# Patient Record
Sex: Male | Born: 1995 | Race: White | Hispanic: No | Marital: Single | Smoking: Never smoker
Health system: Southern US, Community
[De-identification: ages and names within clinical notes are randomized; demographics above are authoritative.]

## PROBLEM LIST (undated history)

## (undated) DIAGNOSIS — F329 Major depressive disorder, single episode, unspecified: Secondary | ICD-10-CM

## (undated) DIAGNOSIS — F32A Depression, unspecified: Secondary | ICD-10-CM

## (undated) DIAGNOSIS — T7840XA Allergy, unspecified, initial encounter: Secondary | ICD-10-CM

## (undated) DIAGNOSIS — R002 Palpitations: Secondary | ICD-10-CM

## (undated) HISTORY — DX: Palpitations: R00.2

## (undated) HISTORY — PX: NO PAST SURGERIES: SHX2092

## (undated) HISTORY — DX: Allergy, unspecified, initial encounter: T78.40XA

## (undated) HISTORY — DX: Depression, unspecified: F32.A

## (undated) HISTORY — DX: Major depressive disorder, single episode, unspecified: F32.9

---

## 2017-04-09 ENCOUNTER — Emergency Department
Admission: EM | Admit: 2017-04-09 | Discharge: 2017-04-09 | Disposition: A | Discharge status: Home / Self Care | Payer: BLUE CROSS/BLUE SHIELD | Attending: Emergency Medicine | Admitting: Emergency Medicine

## 2017-04-09 ENCOUNTER — Other Ambulatory Visit: Payer: Self-pay

## 2017-04-09 ENCOUNTER — Emergency Department: Payer: BLUE CROSS/BLUE SHIELD

## 2017-04-09 DIAGNOSIS — R002 Palpitations: Secondary | ICD-10-CM | POA: Insufficient documentation

## 2017-04-09 DIAGNOSIS — R0602 Shortness of breath: Secondary | ICD-10-CM | POA: Diagnosis not present

## 2017-04-09 DIAGNOSIS — I499 Cardiac arrhythmia, unspecified: Secondary | ICD-10-CM | POA: Diagnosis not present

## 2017-04-09 LAB — COMPREHENSIVE METABOLIC PANEL
ALT: 13 U/L — AB (ref 17–63)
ANION GAP: 12 (ref 5–15)
AST: 18 U/L (ref 15–41)
Albumin: 4.8 g/dL (ref 3.5–5.0)
Alkaline Phosphatase: 54 U/L (ref 38–126)
BUN: 9 mg/dL (ref 6–20)
CHLORIDE: 107 mmol/L (ref 101–111)
CO2: 21 mmol/L — AB (ref 22–32)
CREATININE: 0.82 mg/dL (ref 0.61–1.24)
Calcium: 9.7 mg/dL (ref 8.9–10.3)
Glucose, Bld: 88 mg/dL (ref 65–99)
POTASSIUM: 3.4 mmol/L — AB (ref 3.5–5.1)
SODIUM: 140 mmol/L (ref 135–145)
Total Bilirubin: 2.9 mg/dL — ABNORMAL HIGH (ref 0.3–1.2)
Total Protein: 7.5 g/dL (ref 6.5–8.1)

## 2017-04-09 LAB — TROPONIN I

## 2017-04-09 LAB — CBC WITH DIFFERENTIAL/PLATELET
Basophils Absolute: 0 10*3/uL (ref 0–0.1)
Basophils Relative: 0 %
EOS ABS: 0.1 10*3/uL (ref 0–0.7)
EOS PCT: 1 %
HCT: 45.9 % (ref 40.0–52.0)
Hemoglobin: 15.5 g/dL (ref 13.0–18.0)
LYMPHS ABS: 2.1 10*3/uL (ref 1.0–3.6)
LYMPHS PCT: 26 %
MCH: 31.6 pg (ref 26.0–34.0)
MCHC: 33.8 g/dL (ref 32.0–36.0)
MCV: 93.5 fL (ref 80.0–100.0)
MONO ABS: 0.4 10*3/uL (ref 0.2–1.0)
Monocytes Relative: 5 %
Neutro Abs: 5.7 10*3/uL (ref 1.4–6.5)
Neutrophils Relative %: 68 %
PLATELETS: 245 10*3/uL (ref 150–440)
RBC: 4.91 MIL/uL (ref 4.40–5.90)
RDW: 13.5 % (ref 11.5–14.5)
WBC: 8.4 10*3/uL (ref 3.8–10.6)

## 2017-04-09 LAB — TSH: TSH: 0.759 u[IU]/mL (ref 0.350–4.500)

## 2017-04-09 NOTE — ED Provider Notes (Signed)
[facility] Emergency Department Provider Note   ____________________________________________    I have reviewed the triage vital signs and the nursing notes.   HISTORY  Chief Complaint Palpitations     HPI Craig Ruiz is a 22 y.o. male who presents with complaints of palpitations.  Patient reports several episodes of palpitations over the last few months, however last night he had when he describes as a fairly severe episode where initially he felt that his heart was missing beats and then it became very rapid and like it was beating out of his chest.  He felt mildly short of breath but denies chest pain.  On arrival to the ED feels significant better, is no longer having any palpitations.  No fevers or chills no shortness of breath.  No calf pain or swelling.  No recent travel.  No history of thyroid disease.  Mother does report a history of arrhythmia that required EP.   No past medical history on file.  No past medical history  There are no active problems to display for this patient.     Prior to Admission medications   Not on File  Does not use any medications  Allergies Patient has no known allergies.  No family history on file.  Social History Patient denies drug or alcohol use  Review of Systems  Constitutional: No fever/chills, no weight loss  eyes: No visual changes.  ENT: No neck swelling Cardiovascular: Denies chest pain.  Palpation's as above Respiratory: Denies shortness of breath. Gastrointestinal: No abdominal pain.  No nausea, no vomiting.   Genitourinary: Negative for dysuria. Musculoskeletal: Negative for back pain. Skin: Negative for rash. Neurological: Negative for headaches    ____________________________________________   PHYSICAL EXAM:  VITAL SIGNS: ED Triage Vitals [04/09/17 2005]  Enc Vitals Group     BP 140/63     Pulse Rate 80     Resp 16     Temp 98.9 F (37.2 C)     Temp Source Oral    SpO2 97 %     Weight 63.5 kg (140 lb)     Height 1.803 m (5\' 11" )     Head Circumference      Peak Flow      Pain Score      Pain Loc      Pain Edu?      Excl. in [place]?     Constitutional: Alert and oriented. No acute distress. Pleasant and interactive Eyes: Conjunctivae are normal.   Nose: No congestion/rhinnorhea. Mouth/Throat: Mucous membranes are moist.    Cardiovascular: Normal rate, regular rhythm. Grossly normal heart sounds.  Good peripheral circulation. Respiratory: Normal respiratory effort.  No retractions. Gastrointestinal: Soft and nontender. No distention Genitourinary: deferred Musculoskeletal: Warm and well perfused Neurologic:  Normal speech and language. No gross focal neurologic deficits are appreciated.  Skin:  Skin is warm, dry and intact. No rash noted. Psychiatric: Mood and affect are normal. Speech and behavior are normal.  ____________________________________________   LABS (all labs ordered are listed, but only abnormal results are displayed)  Labs Reviewed  COMPREHENSIVE METABOLIC PANEL - Abnormal; Notable for the following components:      Result Value   Potassium 3.4 (*)    CO2 21 (*)    ALT 13 (*)    Total Bilirubin 2.9 (*)    All other components within normal limits  CBC WITH DIFFERENTIAL/PLATELET  TROPONIN I  TSH   ____________________________________________  EKG ED ECG REPORT I,  Lavonia Drafts, the attending physician, personally viewed and interpreted this ECG.  Date: 04/09/2017  Rhythm: normal sinus rhythm QRS Axis: normal Intervals: normal ST/T Wave abnormalities: normal Narrative Interpretation: Sinus arrhythmia  ____________________________________________  RADIOLOGY  Chest x-ray normal ____________________________________________   PROCEDURES  Procedure(s) performed: No  Procedures   Critical Care performed: No ____________________________________________   INITIAL IMPRESSION / ASSESSMENT AND PLAN / ED  COURSE  Pertinent labs & imaging results that were available during my care of the patient were reviewed by me and considered in my medical decision making (see chart for details).  Patient well-appearing in no acute distress.  EKG is reassuring.  Description of palpitations is suspicious for SVT versus PVCs.  Asymptomatic in the emergency department.  Workup is overall quite reassuring.  However I would like to have the patient followed by cardiology closely with likely 30-day monitor, patient and mother agree with this plan.  Strict return precautions discussed    ____________________________________________   FINAL CLINICAL IMPRESSION(S) / ED DIAGNOSES  Final diagnoses:  Palpitations        Note:  This document was prepared using Dragon voice recognition software and may include unintentional dictation errors.    Lavonia Drafts, MD 04/09/17 2214

## 2017-04-09 NOTE — ED Triage Notes (Signed)
Pt states has had palpitations for months. Pt states this am his palpitations were worse. Pt states last pm he was not able to sleep due to increased palpitations. Pt denies pain, states he does have shob when palpitations occur.

## 2017-04-10 ENCOUNTER — Telehealth: Payer: Self-pay

## 2017-04-10 DIAGNOSIS — R002 Palpitations: Secondary | ICD-10-CM

## 2017-04-10 NOTE — Telephone Encounter (Signed)
Pt in ED 1/27 for palpitations which he reports have been happening over the last few months. Palpitations worsened yesterday and he presented to the ED for evaluation.   Per Dr. Kyra Manges notes: "Patient well-appearing in no acute distress.  EKG is reassuring.  Description of palpitations is suspicious for SVT versus PVCs.  Asymptomatic in the emergency department.  Workup is overall quite reassuring.  However I would like to have the patient followed by cardiology closely with likely 30-day monitor, patient and mother agree with this plan.  Strict return precautions discussed"  Pt was scheduled for March OV with Dr. Rockey Situ. MD states he will review ED notes to determine if holter monitor may be ordered before appt. Routed to Dr. Rockey Situ and Jeannene Patella.

## 2017-04-10 NOTE — Telephone Encounter (Signed)
Okay to order monitor

## 2017-04-10 NOTE — Telephone Encounter (Signed)
Pt mother calling stating pt was in ED last night and was suggested that he may need monitor prior to appointment   Please advise.

## 2017-04-11 NOTE — Addendum Note (Signed)
Addended by: Valora Corporal on: 04/11/2017 12:11 PM   Modules accepted: Orders

## 2017-04-11 NOTE — Telephone Encounter (Signed)
Spoke with patients mother and reviewed that we will order 30 day monitor and have that shipped to the mailing address. Advised that they would call to verify this information before sending it out so to watch for their call. Discussed monitor and that instructions will be in the box. Reviewed information in detail and confirmed upcoming appointment and she verbalized understanding with no further questions at this time. Order entered and enrolled online to be shipped out.

## 2017-04-23 ENCOUNTER — Ambulatory Visit (INDEPENDENT_AMBULATORY_CARE_PROVIDER_SITE_OTHER): Payer: BLUE CROSS/BLUE SHIELD

## 2017-04-23 DIAGNOSIS — R002 Palpitations: Secondary | ICD-10-CM | POA: Diagnosis not present

## 2017-05-13 NOTE — Progress Notes (Signed)
Cardiology Office Note  Date:  05/15/2017   ID:  Georgeanna Lea, DOB Nov 09, 1995, MRN [id number]  PCP:  Patient, No Pcp Per   Chief Complaint  Patient presents with  . OTHER    F/u ED due to palpitations/chest pain. Meds reviewed verbally with pt.    HPI:   Lincoln Ginley is a 22 y.o. male  With history of tachycardia Who presents by referral from Dr. Corky Downs in the emergency room for  paroxysmal tachycardia  Reports that over the past 6 months he has had episodes of tachycardia Reports having one severe episode in January 2019 ER 04/09/2017  severe episode where initially he felt that his heart was missing beats and then it became very rapid and like it was beating out of his chest.   mildly short of breath but denies chest pain.   He became Exhausted, shirt moving up and down, pale , dizzy Witnessed by his mother Rhythm broke before he was seen in the emergency room. He waited for his mother to get home then a presented to the emergency room  Was felt to possibly have SVT  Lab work reviewed personally by myself tbili 2.9 Potassium 3.4  Discussion with family today, Mother with "ablation", "virus", weak heart  Turning wearing 30 day monitor and reports he has had several episodes of tachycardia with a monitor in place We did call the company and have received preliminary data on his rhythm Episode of narrow complex tachycardia rate 170 bpm every 25th 2019 at 2:48 PM Frequent APCs, PVCs atrial tachycardia noted. 25th 2019 2:46 PM rate 140s  EKG personally reviewed by myself on todays visitShows normal sinus rhythm rate 61 bpm repolarization abnormality , no significant ST-T wave changes  PMH:   Tachycardia , paroxysmal    PSH:   History reviewed. No pertinent surgical history.  Current Outpatient Medications  Medication Sig Dispense Refill  . diltiazem (CARDIZEM) 30 MG tablet Take 1 tablet (30 mg total) by mouth 3 (three) times daily as needed (As needed for fast heart  rate). 90 tablet 1  . propranolol (INDERAL) 20 MG tablet Take 1 tablet (20 mg total) by mouth 3 (three) times daily as needed (As needed for fast heart rate). 90 tablet 1   No current facility-administered medications for this visit.      Allergies:   Patient has no known allergies.   Social History:  The patient  reports that  has never smoked. he has never used smokeless tobacco. He reports that he does not drink alcohol or use drugs.   Family History:   family history includes Arrhythmia in his mother; Heart failure in his mother.    Review of Systems: Review of Systems  Constitutional: Negative.   Respiratory: Negative.   Cardiovascular: Negative.   Gastrointestinal: Negative.   Musculoskeletal: Negative.   Neurological: Negative.   Psychiatric/Behavioral: Negative.   All other systems reviewed and are negative.    PHYSICAL EXAM: VS:  BP 114/78 (BP Location: Right Arm, Patient Position: Sitting, Cuff Size: Normal)   Pulse 61   Ht 5\' 11"  (1.803 m)   Wt 137 lb (62.1 kg)   BMI 19.11 kg/m  , BMI Body mass index is 19.11 kg/m. GEN: Well nourished, well developed, in no acute distress  HEENT: normal  Neck: no JVD, carotid bruits, or masses Cardiac: RRR; no murmurs, rubs, or gallops,no edema  Respiratory:  clear to auscultation bilaterally, normal work of breathing GI: soft, nontender, nondistended, + BS MS: no  deformity or atrophy  Skin: warm and dry, no rash Neuro:  Strength and sensation are intact Psych: euthymic mood, full affect    Recent Labs: 04/09/2017: ALT 13; BUN 9; Creatinine, Ser 0.82; Hemoglobin 15.5; Platelets 245; Potassium 3.4; Sodium 140; TSH 0.759    Lipid Panel No results found for: CHOL, HDL, LDLCALC, TRIG    Wt Readings from Last 3 Encounters:  05/15/17 137 lb (62.1 kg) (22 %, Z= -0.76)*  04/09/17 140 lb (63.5 kg) (28 %, Z= -0.59)*   * Growth percentiles are based on CDC (Boys, 2-20 Years) data.       ASSESSMENT AND  PLAN:  Paroxysmal tachycardia ([place]) - Plan: EKG 12-Lead Several episodes over the past 3 weeks per the patient 30 day monitor reviewed in pieces Final report pending Plan as below Long discussion concerning various stops of arrhythmia  Palpitations - Plan: EKG 12-Lead Frequent APCs and PVCs noted  Paroxysmal SVT (supraventricular tachycardia) ([facility]) Very severe symptoms general 2019 when he went to the emergency room Symptoms concerning for supraventricular tachycardia Was not able to obtain EKG while he was in this rhythm as he waited for his mother to get home Having rare episodes but still symptomatic Long discussion concerning mechanism, management Long discussion concerning ablation, medications at could be used He does not want a medication every day, does not want ablation at this time Prefers to try medications on an as-needed basis Mother is wary of beta blockers Recommended he try propranolol 10-20 mg as needed, and diltiazem 30 mg pills as needed  Atrial tachycardia ([place])  unable to exclude other arrhythmia such as atrial tachycardia   30 day monitor has not been completed though we did receive 1 rhythm strip after contacting the company with rate 140 Again recommended beta blockers  Elevated total bilirubin Seen on lab work in the emergency room 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 EKG 12-Lead     Signed, Esmond Plants, M.D., Ph.D. 05/15/2017  [place], [place]

## 2017-05-15 ENCOUNTER — Encounter: Payer: Self-pay | Admitting: Cardiovascular Disease

## 2017-05-15 ENCOUNTER — Ambulatory Visit (INDEPENDENT_AMBULATORY_CARE_PROVIDER_SITE_OTHER): Payer: BLUE CROSS/BLUE SHIELD | Admitting: Cardiovascular Disease

## 2017-05-15 VITALS — BP 114/78 | HR 61 | Ht 71.0 in | Wt 137.0 lb

## 2017-05-15 DIAGNOSIS — R002 Palpitations: Secondary | ICD-10-CM

## 2017-05-15 DIAGNOSIS — I471 Supraventricular tachycardia: Secondary | ICD-10-CM

## 2017-05-15 DIAGNOSIS — I479 Paroxysmal tachycardia, unspecified: Secondary | ICD-10-CM

## 2017-05-15 MED ORDER — DILTIAZEM HCL 30 MG PO TABS: 30.0000 mg | ORAL_TABLET | Freq: Three times a day (TID) | ORAL | 1 refills | Status: DC | PRN

## 2017-05-15 MED ORDER — PROPRANOLOL HCL 20 MG PO TABS: 20.0000 mg | ORAL_TABLET | Freq: Three times a day (TID) | ORAL | 1 refills | Status: DC | PRN

## 2017-05-15 NOTE — Patient Instructions (Addendum)
Research atrial tachycardia (rate 100 to 150s)  SVT,  supra ventricular tachycardia ( rate 150 to 220),  easier to ablate   Medication Instructions:  Your physician has recommended you make the following change in your medication:  1. Propranolol 20 mg every 4 to 6 hours as needed for fast heart rate   OR 2. Diltiazem 30 mg every 4 to 6 hours as needed for fast heart rate   Labwork:  No new labs needed  Testing/Procedures:  No further testing at this time   Follow-Up: It was a pleasure seeing you in the office today. Please call us if you have new issues that need to be addressed before your next appt.  [phone]  Your physician wants you to follow-up in:  As needed  If you need a refill on your cardiac medications before your next appointment, please call your pharmacy.  For educational health videos Log in to : [email or web address] Or : [email or web address], password : [email or web address]

## 2018-04-12 ENCOUNTER — Ambulatory Visit (INDEPENDENT_AMBULATORY_CARE_PROVIDER_SITE_OTHER): Payer: BLUE CROSS/BLUE SHIELD | Admitting: Family Medicine

## 2018-04-12 ENCOUNTER — Encounter: Payer: Self-pay | Admitting: Family Medicine

## 2018-04-12 VITALS — BP 104/62 | HR 72 | Temp 98.8°F | Ht 69.5 in | Wt 128.8 lb

## 2018-04-12 DIAGNOSIS — F410 Panic disorder [episodic paroxysmal anxiety] without agoraphobia: Secondary | ICD-10-CM | POA: Diagnosis not present

## 2018-04-12 DIAGNOSIS — F411 Generalized anxiety disorder: Secondary | ICD-10-CM | POA: Diagnosis not present

## 2018-04-12 DIAGNOSIS — F430 Acute stress reaction: Secondary | ICD-10-CM | POA: Diagnosis not present

## 2018-04-12 DIAGNOSIS — R464 Slowness and poor responsiveness: Secondary | ICD-10-CM | POA: Insufficient documentation

## 2018-04-12 MED ORDER — SERTRALINE HCL 50 MG PO TABS: ORAL_TABLET | ORAL | 3 refills | Status: DC

## 2018-04-12 NOTE — Assessment & Plan Note (Signed)
Given other psych issues this seems most likely. Though possibility that this could be seizure disorder. Discussed neurology vs starting medication and patient elected to start medication and will place neurology referral to see if symptoms worsen/or do not improve.

## 2018-04-12 NOTE — Progress Notes (Signed)
Subjective:     Craig Ruiz is a 23 y.o. male presenting for Establish Care (previous PCP with [facility]); Anxiety (has not been treated for this before. Randomly gets attacks of feeling something coming on "like a bad dejavu"); and Depression     Anxiety  Presents for initial visit. Onset was 1 to 6 months ago. The problem has been unchanged. Symptoms include confusion, decreased concentration, dizziness, excessive worry, insomnia, irritability, nausea, nervous/anxious behavior and obsessions (will repeatedly fix the same thing on his car). Patient reports no hyperventilation, malaise, palpitations, restlessness or shortness of breath. Episode frequency: has an episode every day. The most recent episode lasted 30 minutes. The severity of symptoms is interfering with daily activities, incapacitating and causing significant distress. The symptoms are aggravated by work stress (early in the AM or towards lunchtime). The patient sleeps 5 hours per night. The quality of sleep is fair. Nighttime awakenings: one to two.   There are no known risk factors. Treatments tried: trying to work on car or spend time with friends to take his mind off of things.  Depression         This is a new problem.  The current episode started more than 1 month ago.   The onset quality is gradual.   Associated symptoms include decreased concentration and insomnia.  Associated symptoms include no restlessness.     Exacerbated by: with break-up from partner.  Past medical history includes anxiety.     #Passing out - will being feeling completely normal - then will have an episode where he feels like everything goes "fuzzy" - out of body experience like - mom has witnessed episodes and he was completely glazed over - has a bewildered look on his face - no twitching or shaking - often associated with stress, but occasionally be random w/o worry  Review of Systems  Constitutional: Positive for  irritability. Negative for chills and fever.  HENT: Negative.   Eyes:       Blurry vision with episodes  Respiratory: Negative for shortness of breath.   Cardiovascular: Negative for palpitations.  Gastrointestinal: Positive for nausea.  Endocrine: Negative.   Genitourinary: Negative.   Musculoskeletal: Negative.   Allergic/Immunologic: Negative.   Neurological: Positive for dizziness.  Hematological: Negative.   Psychiatric/Behavioral: Positive for confusion, decreased concentration and depression. The patient is nervous/anxious and has insomnia.    PMH, PSH, FMHx reviewed and updated in EPIC   Social History   Tobacco Use  Smoking Status Never Smoker  Smokeless Tobacco Never Used   Social History   Social History Narrative   Lives with Mom and Dad, gets along well   Works at apartment complex as Fish farm manager   Enjoys: work on his car, spend time with friends   Exercise: walking around at work and heavy lifting   Diet: mostly salads, and chicken at home   Has a partner - Raquel Sarna        Objective:    BP Readings from Last 3 Encounters:  04/12/18 104/62  05/15/17 114/78  04/09/17 112/72   Wt Readings from Last 3 Encounters:  04/12/18 128 lb 12 oz (58.4 kg)  05/15/17 137 lb (62.1 kg) (22 %, Z= -0.76)*  04/09/17 140 lb (63.5 kg) (28 %, Z= -0.59)*   * Growth percentiles are based on CDC (Boys, 2-20 Years) data.    BP 104/62   Pulse 72   Temp 98.8 F (37.1 C)   Ht 5' 9.5" (1.765 m)  Wt 128 lb 12 oz (58.4 kg)   SpO2 97%   BMI 18.74 kg/m    Physical Exam Constitutional:      Appearance: Normal appearance. He is not ill-appearing or diaphoretic.  HENT:     Right Ear: External ear normal.     Left Ear: External ear normal.     Nose: Nose normal.  Eyes:     General: No scleral icterus.    Extraocular Movements: Extraocular movements intact.     Conjunctiva/sclera: Conjunctivae normal.  Neck:     Musculoskeletal: Neck supple.  Cardiovascular:      Rate and Rhythm: Normal rate.  Pulmonary:     Effort: Pulmonary effort is normal.  Skin:    General: Skin is warm and dry.  Neurological:     Mental Status: He is alert. Mental status is at baseline.  Psychiatric:        Attention and Perception: Attention normal.        Mood and Affect: Mood is depressed. Affect is blunt.        Speech: Speech normal.        Behavior: Behavior normal. Behavior is cooperative.        Thought Content: Thought content normal. Thought content does not include homicidal or suicidal plan.    GAD 7 : Generalized Anxiety Score 04/12/2018  Nervous, Anxious, on Edge 2  Control/stop worrying 2  Worry too much - different things 2  Trouble relaxing 2  Restless 2  Easily annoyed or irritable 2  Afraid - awful might happen 2  Total GAD 7 Score 14  Anxiety Difficulty Very difficult   Depression screen PHQ 2/9 04/12/2018  Decreased Interest 2  Down, Depressed, Hopeless 2  PHQ - 2 Score 4  Altered sleeping 3  Tired, decreased energy 1  Change in appetite 1  Feeling bad or failure about yourself  2  Trouble concentrating 2  Moving slowly or fidgety/restless 2  Suicidal thoughts 1  PHQ-9 Score 16  Difficult doing work/chores Very difficult         Assessment & Plan:   Problem List Items Addressed This Visit      Other   Generalized anxiety disorder with panic attacks - Primary    GAD elevated and episodes could be panic attacks. Discussed therapy but patient declined. Will start medication.       Relevant Medications   sertraline (ZOLOFT) 50 MG tablet   Acute stress reaction   Relevant Medications   sertraline (ZOLOFT) 50 MG tablet   Slowness and poor responsiveness    Given other psych issues this seems most likely. Though possibility that this could be seizure disorder. Discussed neurology vs starting medication and patient elected to start medication and will place neurology referral to see if symptoms worsen/or do not improve.        Relevant Orders   Ambulatory referral to Neurology       Return in about 4 weeks (around 05/10/2018).  Lesleigh Noe, MD

## 2018-04-12 NOTE — Assessment & Plan Note (Signed)
GAD elevated and episodes could be panic attacks. Discussed therapy but patient declined. Will start medication.

## 2018-04-12 NOTE — Patient Instructions (Signed)
You are going to start a new antidepressant medication.   One of the risks of this medication is increase in suicidal thoughts.   Your suicide Action plan is as follows:  1) Call Mom 2) Call the Suicide Hotline [phone] which is available 24 hours 3) Call the Clinic   The most common side effect is stomach upset. If this happens it means the medication is working. It should get better in 1-3 weeks.   Medication for depression and anxiety often takes 6-8 weeks to have a noticeable difference so stick with it. Also the best way for recovery is taking medication and seeing a therapist -- this is so important.    Will also refer to neurology just to check

## 2018-05-10 ENCOUNTER — Ambulatory Visit (INDEPENDENT_AMBULATORY_CARE_PROVIDER_SITE_OTHER): Payer: BLUE CROSS/BLUE SHIELD | Admitting: Family Medicine

## 2018-05-10 ENCOUNTER — Encounter: Payer: Self-pay | Admitting: Family Medicine

## 2018-05-10 VITALS — BP 98/62 | HR 63 | Temp 98.3°F | Resp 12 | Ht 69.5 in | Wt 130.8 lb

## 2018-05-10 DIAGNOSIS — F411 Generalized anxiety disorder: Secondary | ICD-10-CM | POA: Diagnosis not present

## 2018-05-10 DIAGNOSIS — F410 Panic disorder [episodic paroxysmal anxiety] without agoraphobia: Secondary | ICD-10-CM | POA: Diagnosis not present

## 2018-05-10 DIAGNOSIS — F430 Acute stress reaction: Secondary | ICD-10-CM | POA: Diagnosis not present

## 2018-05-10 DIAGNOSIS — R464 Slowness and poor responsiveness: Secondary | ICD-10-CM

## 2018-05-10 MED ORDER — SERTRALINE HCL 100 MG PO TABS: 100.0000 mg | ORAL_TABLET | Freq: Every day | ORAL | 1 refills | Status: DC

## 2018-05-10 NOTE — Patient Instructions (Addendum)
#   Anxiety - Glad you are doing better - if you want to increase to 150 mg in 2-3 weeks you can, and then you can go up to 200 mg if you would like   Return in 8 weeks to check in and see how things are going

## 2018-05-10 NOTE — Progress Notes (Signed)
Subjective:     Craig Ruiz is a 23 y.o. male presenting for Anxiety (4 week follow up)     HPI   #Anxiety/depression - Started sertraline - a lot of stressful events - sister almost died - doing better overall - did increase to 100 mg  - has noticed a difference - easier to cope with things - has noticed appetite   #episodes of poor responsiveness - are the same, no improvement - typically occur at rest  - has happened a few times during driving  - is aware of what he is doing, but it is weird sensation  - has pulled off the road - occasionally notices some racing heart or skipping beats   Review of Systems See HPI  04/12/2018: Clinic - GAD and stress > Sertraline. Neuro referral for slow responsiveness.   Social History   Tobacco Use  Smoking Status Never Smoker  Smokeless Tobacco Never Used        Objective:    BP Readings from Last 3 Encounters:  05/10/18 98/62  04/12/18 104/62  05/15/17 114/78   Wt Readings from Last 3 Encounters:  05/10/18 130 lb 12 oz (59.3 kg)  04/12/18 128 lb 12 oz (58.4 kg)  05/15/17 137 lb (62.1 kg) (22 %, Z= -0.76)*   * Growth percentiles are based on CDC (Boys, 2-20 Years) data.    BP 98/62   Pulse 63   Temp 98.3 F (36.8 C)   Resp 12   Ht 5' 9.5" (1.765 m)   Wt 130 lb 12 oz (59.3 kg)   BMI 19.03 kg/m    Physical Exam Constitutional:      Appearance: Normal appearance. He is not ill-appearing or diaphoretic.  HENT:     Right Ear: External ear normal.     Left Ear: External ear normal.     Nose: Nose normal.  Eyes:     General: No scleral icterus.    Extraocular Movements: Extraocular movements intact.     Conjunctiva/sclera: Conjunctivae normal.  Neck:     Musculoskeletal: Neck supple.  Cardiovascular:     Rate and Rhythm: Normal rate.  Pulmonary:     Effort: Pulmonary effort is normal.  Skin:    General: Skin is warm and dry.  Neurological:     Mental Status: He is alert. Mental status is at  baseline.  Psychiatric:        Mood and Affect: Mood normal.      GAD 7 : Generalized Anxiety Score 05/10/2018 04/12/2018  Nervous, Anxious, on Edge 2 2  Control/stop worrying 2 2  Worry too much - different things 2 2  Trouble relaxing 2 2  Restless 1 2  Easily annoyed or irritable 1 2  Afraid - awful might happen 1 2  Total GAD 7 Score 11 14  Anxiety Difficulty Somewhat difficult Very difficult    Depression screen [facility] 2/9 05/10/2018 04/12/2018  Decreased Interest 1 2  Down, Depressed, Hopeless 2 2  PHQ - 2 Score 3 4  Altered sleeping 1 3  Tired, decreased energy 1 1  Change in appetite 1 1  Feeling bad or failure about yourself  2 2  Trouble concentrating 2 2  Moving slowly or fidgety/restless 1 2  Suicidal thoughts 1 1  PHQ-9 Score 12 16  Difficult doing work/chores Somewhat difficult Very difficult        Assessment & Plan:   Problem List Items Addressed This Visit  Other   Generalized anxiety disorder with panic attacks    Doing better with medication. Discussed that it is OK to increase to 150 mg in a few weeks if not noticing much change. Declined therapy.       Relevant Medications   sertraline (ZOLOFT) 100 MG tablet   Acute stress reaction   Relevant Medications   sertraline (ZOLOFT) 100 MG tablet   Slowness and poor responsiveness - Primary    No change with medication. Already has neurology scheduled. Does not seem cardiac in origin based on hx          Return in about 8 weeks (around 07/05/2018).  Lesleigh Noe, MD

## 2018-05-10 NOTE — Assessment & Plan Note (Signed)
Doing better with medication. Discussed that it is OK to increase to 150 mg in a few weeks if not noticing much change. Declined therapy.

## 2018-05-10 NOTE — Assessment & Plan Note (Signed)
No change with medication. Already has neurology scheduled. Does not seem cardiac in origin based on hx

## 2018-05-28 DIAGNOSIS — D496 Neoplasm of unspecified behavior of brain: Secondary | ICD-10-CM | POA: Diagnosis not present

## 2018-05-28 DIAGNOSIS — G40A09 Absence epileptic syndrome, not intractable, without status epilepticus: Secondary | ICD-10-CM | POA: Diagnosis not present

## 2018-05-28 DIAGNOSIS — R404 Transient alteration of awareness: Secondary | ICD-10-CM | POA: Diagnosis not present

## 2018-05-28 DIAGNOSIS — G9389 Other specified disorders of brain: Secondary | ICD-10-CM | POA: Diagnosis not present

## 2018-05-28 DIAGNOSIS — I499 Cardiac arrhythmia, unspecified: Secondary | ICD-10-CM | POA: Diagnosis not present

## 2018-05-28 DIAGNOSIS — F329 Major depressive disorder, single episode, unspecified: Secondary | ICD-10-CM | POA: Diagnosis not present

## 2018-05-28 DIAGNOSIS — R51 Headache: Secondary | ICD-10-CM | POA: Diagnosis not present

## 2018-05-28 DIAGNOSIS — Z4889 Encounter for other specified surgical aftercare: Secondary | ICD-10-CM | POA: Diagnosis not present

## 2018-05-28 DIAGNOSIS — R900 Intracranial space-occupying lesion found on diagnostic imaging of central nervous system: Secondary | ICD-10-CM | POA: Diagnosis not present

## 2018-05-28 DIAGNOSIS — R0902 Hypoxemia: Secondary | ICD-10-CM | POA: Diagnosis not present

## 2018-05-28 DIAGNOSIS — R000 Tachycardia, unspecified: Secondary | ICD-10-CM | POA: Diagnosis not present

## 2018-05-28 DIAGNOSIS — I491 Atrial premature depolarization: Secondary | ICD-10-CM | POA: Diagnosis not present

## 2018-05-28 DIAGNOSIS — C712 Malignant neoplasm of temporal lobe: Secondary | ICD-10-CM | POA: Diagnosis not present

## 2018-05-28 DIAGNOSIS — G40409 Other generalized epilepsy and epileptic syndromes, not intractable, without status epilepticus: Secondary | ICD-10-CM | POA: Diagnosis not present

## 2018-05-28 DIAGNOSIS — H902 Conductive hearing loss, unspecified: Secondary | ICD-10-CM | POA: Diagnosis not present

## 2018-05-28 DIAGNOSIS — R040 Epistaxis: Secondary | ICD-10-CM | POA: Diagnosis not present

## 2018-05-28 DIAGNOSIS — Z01818 Encounter for other preprocedural examination: Secondary | ICD-10-CM | POA: Diagnosis not present

## 2018-05-28 DIAGNOSIS — E348 Other specified endocrine disorders: Secondary | ICD-10-CM | POA: Diagnosis not present

## 2018-05-28 DIAGNOSIS — R930 Abnormal findings on diagnostic imaging of skull and head, not elsewhere classified: Secondary | ICD-10-CM | POA: Diagnosis not present

## 2018-05-28 DIAGNOSIS — T380X5A Adverse effect of glucocorticoids and synthetic analogues, initial encounter: Secondary | ICD-10-CM | POA: Diagnosis not present

## 2018-05-28 DIAGNOSIS — G936 Cerebral edema: Secondary | ICD-10-CM | POA: Diagnosis not present

## 2018-05-28 DIAGNOSIS — R569 Unspecified convulsions: Secondary | ICD-10-CM | POA: Diagnosis not present

## 2018-05-28 DIAGNOSIS — D430 Neoplasm of uncertain behavior of brain, supratentorial: Secondary | ICD-10-CM | POA: Diagnosis not present

## 2018-05-28 DIAGNOSIS — G40309 Generalized idiopathic epilepsy and epileptic syndromes, not intractable, without status epilepticus: Secondary | ICD-10-CM | POA: Diagnosis not present

## 2018-05-29 ENCOUNTER — Telehealth: Payer: Self-pay | Admitting: Family Medicine

## 2018-05-29 DIAGNOSIS — R569 Unspecified convulsions: Secondary | ICD-10-CM | POA: Diagnosis not present

## 2018-05-29 DIAGNOSIS — R900 Intracranial space-occupying lesion found on diagnostic imaging of central nervous system: Secondary | ICD-10-CM | POA: Diagnosis not present

## 2018-05-29 DIAGNOSIS — Z01818 Encounter for other preprocedural examination: Secondary | ICD-10-CM | POA: Diagnosis not present

## 2018-05-29 DIAGNOSIS — G9389 Other specified disorders of brain: Secondary | ICD-10-CM | POA: Diagnosis not present

## 2018-05-29 MED ORDER — LEVETIRACETAM 500 MG PO TABS
1000.00 | ORAL_TABLET | ORAL | Status: DC
Start: 2018-05-30 — End: 2018-05-29

## 2018-05-29 MED ORDER — SERTRALINE HCL 50 MG PO TABS
100.00 | ORAL_TABLET | ORAL | Status: DC
Start: 2018-06-05 — End: 2018-05-29

## 2018-05-29 MED ORDER — ACETAMINOPHEN 325 MG PO TABS
650.00 | ORAL_TABLET | ORAL | Status: DC
Start: ? — End: 2018-05-29

## 2018-05-29 MED ORDER — SODIUM CHLORIDE 0.9 % IV SOLN
75.00 | INTRAVENOUS | Status: DC
Start: ? — End: 2018-05-29

## 2018-05-29 NOTE — Telephone Encounter (Signed)
Called to speak with Maudie Mercury [facility] Mom).   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   Expressed that ultimately it was up to them, but based on what the neurosurgeon was saying it would be difficult to know what type of tumor it was without a sample of tissue.   Recommended asking more questions about risks/survival of surgery and the recovery.   Also will reach out tomorrow to check in.

## 2018-05-29 NOTE — Telephone Encounter (Signed)
Best number [phone] (kim mom)  Wille Glaser dad) [phone]  Dad called to say pt is in [place] he went yesterday 3/16 with a seizure They did ct scan and MRI  nuero surgeron stated pt has tumer on brain and wanted to talk to you about this.  The neuro surgeon talked to them about pt having surgery on Friday.    They would like to talk Dr Einar Pheasant as soon as possible about this

## 2018-05-30 DIAGNOSIS — D496 Neoplasm of unspecified behavior of brain: Secondary | ICD-10-CM | POA: Diagnosis not present

## 2018-05-30 DIAGNOSIS — Z4889 Encounter for other specified surgical aftercare: Secondary | ICD-10-CM | POA: Diagnosis not present

## 2018-05-30 DIAGNOSIS — R569 Unspecified convulsions: Secondary | ICD-10-CM | POA: Diagnosis not present

## 2018-05-30 DIAGNOSIS — G9389 Other specified disorders of brain: Secondary | ICD-10-CM | POA: Diagnosis not present

## 2018-05-30 DIAGNOSIS — D430 Neoplasm of uncertain behavior of brain, supratentorial: Secondary | ICD-10-CM | POA: Diagnosis not present

## 2018-05-30 DIAGNOSIS — F329 Major depressive disorder, single episode, unspecified: Secondary | ICD-10-CM | POA: Diagnosis not present

## 2018-05-30 MED ORDER — SODIUM CHLORIDE 0.9 % IV SOLN
INTRAVENOUS | Status: DC
Start: ? — End: 2018-05-30

## 2018-05-30 MED ORDER — METOCLOPRAMIDE HCL 5 MG/ML IJ SOLN
10.00 | INTRAMUSCULAR | Status: DC
Start: ? — End: 2018-05-30

## 2018-05-30 MED ORDER — FENTANYL CITRATE (PF) 50 MCG/ML IJ SOLN
50.00 | INTRAMUSCULAR | Status: DC
Start: ? — End: 2018-05-30

## 2018-05-30 NOTE — Telephone Encounter (Signed)
Called to check in on family and patient.   Craig Ruiz is currently in surgery - started this morning and should take 6 hours.   Wondering about neurology appointment on 06/01/2018.   Advised calling to reschedule the appointment   Keep appointment with me on 07/05/2018 - no need for earlier appointment given that he will likely have f/u with neurosurgery and neurology following surgery today.   Let mom know that we are available if needed and to not hesitate to reach out

## 2018-05-31 DIAGNOSIS — R569 Unspecified convulsions: Secondary | ICD-10-CM | POA: Diagnosis not present

## 2018-05-31 DIAGNOSIS — G9389 Other specified disorders of brain: Secondary | ICD-10-CM | POA: Diagnosis not present

## 2018-05-31 DIAGNOSIS — I499 Cardiac arrhythmia, unspecified: Secondary | ICD-10-CM | POA: Diagnosis not present

## 2018-06-01 ENCOUNTER — Ambulatory Visit: Payer: BLUE CROSS/BLUE SHIELD | Admitting: Neurology

## 2018-06-04 MED ORDER — TAMSULOSIN HCL 0.4 MG PO CAPS
0.40 | ORAL_CAPSULE | ORAL | Status: DC
Start: 2018-06-05 — End: 2018-06-04

## 2018-06-04 MED ORDER — ONDANSETRON HCL 4 MG/2ML IJ SOLN
4.00 | INTRAMUSCULAR | Status: DC
Start: ? — End: 2018-06-04

## 2018-06-04 MED ORDER — FAMOTIDINE 20 MG PO TABS
20.00 | ORAL_TABLET | ORAL | Status: DC
Start: 2018-06-04 — End: 2018-06-04

## 2018-06-04 MED ORDER — GENERIC EXTERNAL MEDICATION
6.00 | Status: DC
Start: 2018-06-04 — End: 2018-06-04

## 2018-06-04 MED ORDER — AYR SALINE NASAL NA GEL
1.00 | NASAL | Status: DC
Start: ? — End: 2018-06-04

## 2018-06-04 MED ORDER — HYDROCODONE-ACETAMINOPHEN 5-325 MG PO TABS
2.00 | ORAL_TABLET | ORAL | Status: DC
Start: ? — End: 2018-06-04

## 2018-06-04 MED ORDER — GENERIC EXTERNAL MEDICATION
750.00 | Status: DC
Start: 2018-06-04 — End: 2018-06-04

## 2018-06-04 MED ORDER — HYDROCODONE-ACETAMINOPHEN 5-325 MG PO TABS
1.00 | ORAL_TABLET | ORAL | Status: DC
Start: ? — End: 2018-06-04

## 2018-06-04 MED ORDER — OXYMETAZOLINE HCL 0.05 % NA SOLN
2.00 | NASAL | Status: DC
Start: ? — End: 2018-06-04

## 2018-06-04 MED ORDER — GENERIC EXTERNAL MEDICATION
Status: DC
Start: ? — End: 2018-06-04

## 2018-06-08 DIAGNOSIS — G9389 Other specified disorders of brain: Secondary | ICD-10-CM | POA: Diagnosis not present

## 2018-06-19 ENCOUNTER — Telehealth: Payer: Self-pay | Admitting: Family Medicine

## 2018-06-19 NOTE — Telephone Encounter (Signed)
Called to check in on patient who is post-op from surgery.   Spoke with mom.   She reported patient is doing well, good spirits and healing up from surgery. She did note that he had one lingering stitch in the scalp.   He is waiting to find out if he qualifies for a clinical trial before starting chemotherapy.   Mom plans to call his surgeon regarding stitch. The family lives in [place] so offered that we could remove the stitch here - or ideally it could be removed by oncology at next upcoming appointment to limit outings.   Let us know if he needs anything. He continues on the antidepressant medication.   Lesleigh Noe

## 2018-07-03 ENCOUNTER — Telehealth: Payer: Self-pay

## 2018-07-03 NOTE — Telephone Encounter (Signed)
Left message for patient to call back. Wanted to follow up to see how patient is doing. Also if we need to re schedule his appointment on 07/05/2018 or change it to virtual visit.

## 2018-07-04 NOTE — Telephone Encounter (Signed)
Spoke with patient's mom and patient is going to keep his appointment and make it virtual (DOXY. ME).

## 2018-07-05 ENCOUNTER — Encounter: Payer: Self-pay | Admitting: Family Medicine

## 2018-07-05 ENCOUNTER — Ambulatory Visit (INDEPENDENT_AMBULATORY_CARE_PROVIDER_SITE_OTHER): Payer: BLUE CROSS/BLUE SHIELD | Admitting: Family Medicine

## 2018-07-05 VITALS — Temp 98.6°F | Ht 69.5 in | Wt 138.0 lb

## 2018-07-05 DIAGNOSIS — F410 Panic disorder [episodic paroxysmal anxiety] without agoraphobia: Secondary | ICD-10-CM | POA: Diagnosis not present

## 2018-07-05 DIAGNOSIS — F411 Generalized anxiety disorder: Secondary | ICD-10-CM | POA: Diagnosis not present

## 2018-07-05 DIAGNOSIS — C719 Malignant neoplasm of brain, unspecified: Secondary | ICD-10-CM | POA: Diagnosis not present

## 2018-07-05 NOTE — Progress Notes (Signed)
I connected with Craig Ruiz on 07/05/18 at  8:00 AM EDT by video and verified that I am speaking with the correct person using two identifiers.   I discussed the limitations, risks, security and privacy concerns of performing an evaluation and management service by video and the availability of in person appointments. I also discussed with the patient that there may be a patient responsible charge related to this service. The patient expressed understanding and agreed to proceed.  Patient location: Home Provider Location: [place] Participants: Lesleigh Noe and Logon Uttech and mother Beau Ramsburg   Subjective:     Craig Ruiz is a 23 y.o. 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 The patient is not nervous/anxious.    Reviewed Outside Records 3/16-3/23/2020: Admission - grand mal seizure and found to have brain mass. 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 Ophtho consult due to double vision post-op. Total resection 06/08/2018: Rad/Onc - initial consult - planning to have radiation/chemo 06/21/2018: Telephone - Peds heme/onc - evaluation for possible clinical trial for treatment.      Social History   Tobacco Use  Smoking Status Never  Smoker  Smokeless Tobacco Never Used        Objective:   BP Readings from Last 3 Encounters:  05/10/18 98/62  04/12/18 104/62  05/15/17 114/78   Wt Readings from Last 3 Encounters:  07/05/18 138 lb (62.6 kg)  05/10/18 130 lb 12 oz (59.3 kg)  04/12/18 128 lb 12 oz (58.4 kg)    Temp 98.6 F (37 C) Comment: per patient  Ht 5' 9.5" (1.765 m)   Wt 138 lb (62.6 kg) Comment: per patient  BMI 20.09 kg/m   Physical Exam Constitutional:      Appearance: Normal appearance.  HENT:     Head: Normocephalic.     Nose: Nose normal.  Eyes:     Conjunctiva/sclera: Conjunctivae normal.  Pulmonary:     Effort: Pulmonary effort is normal. No respiratory distress.  Neurological:     General: No focal deficit present.     Mental Status: He is alert.  Psychiatric:        Mood and Affect: Mood normal.        Behavior: Behavior normal.        Thought Content: Thought content normal.        Judgment: Judgment normal.            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 Has ENT and Optho follow-ups planned for double vision and ear complaint. Overall doing well post-op.         Other   Generalized anxiety disorder with panic attacks - Primary    Symptoms significantly improved following surgery. Discussed that the tumor may have been more active source of his symptoms. Will start titrating the medication down and reassess.           Return in about 2 weeks (around 07/19/2018) for Phone or myChart to check in on meds.  Lesleigh Noe, MD

## 2018-07-05 NOTE — Assessment & Plan Note (Signed)
Symptoms significantly improved following surgery. Discussed that the tumor may have been more active source of his symptoms. Will start titrating the medication down and reassess.

## 2018-07-05 NOTE — Patient Instructions (Signed)
Anxiety - I'm glad you are doing better! - try decreasing to 50 mg - send me a message or call the clinic in a couple of weeks to check in - OK to continue to decrease dose if doing well.

## 2018-07-05 NOTE — Assessment & Plan Note (Signed)
Following with [facility] and planning to start chemo in 1 week. Has ENT and Optho follow-ups planned for double vision and ear complaint. Overall doing well post-op.

## 2018-07-09 DIAGNOSIS — H4911 Fourth [trochlear] nerve palsy, right eye: Secondary | ICD-10-CM | POA: Diagnosis not present

## 2018-07-09 DIAGNOSIS — S0461XS Injury of acoustic nerve, right side, sequela: Secondary | ICD-10-CM | POA: Diagnosis not present

## 2018-07-09 DIAGNOSIS — C719 Malignant neoplasm of brain, unspecified: Secondary | ICD-10-CM | POA: Diagnosis not present

## 2018-07-12 DIAGNOSIS — C719 Malignant neoplasm of brain, unspecified: Secondary | ICD-10-CM | POA: Diagnosis not present

## 2018-07-16 DIAGNOSIS — C712 Malignant neoplasm of temporal lobe: Secondary | ICD-10-CM | POA: Diagnosis not present

## 2018-07-16 DIAGNOSIS — H532 Diplopia: Secondary | ICD-10-CM | POA: Diagnosis not present

## 2018-07-16 DIAGNOSIS — C719 Malignant neoplasm of brain, unspecified: Secondary | ICD-10-CM | POA: Diagnosis not present

## 2018-07-16 DIAGNOSIS — H4911 Fourth [trochlear] nerve palsy, right eye: Secondary | ICD-10-CM | POA: Diagnosis not present

## 2018-07-19 DIAGNOSIS — C712 Malignant neoplasm of temporal lobe: Secondary | ICD-10-CM | POA: Diagnosis not present

## 2018-07-19 DIAGNOSIS — C719 Malignant neoplasm of brain, unspecified: Secondary | ICD-10-CM | POA: Diagnosis not present

## 2018-07-20 DIAGNOSIS — Z1159 Encounter for screening for other viral diseases: Secondary | ICD-10-CM | POA: Diagnosis not present

## 2018-07-23 ENCOUNTER — Ambulatory Visit: Payer: BLUE CROSS/BLUE SHIELD | Admitting: Neurology

## 2018-07-26 DIAGNOSIS — H4911 Fourth [trochlear] nerve palsy, right eye: Secondary | ICD-10-CM | POA: Diagnosis not present

## 2018-07-26 DIAGNOSIS — R569 Unspecified convulsions: Secondary | ICD-10-CM | POA: Diagnosis not present

## 2018-07-26 DIAGNOSIS — X58XXXS Exposure to other specified factors, sequela: Secondary | ICD-10-CM | POA: Diagnosis not present

## 2018-07-26 DIAGNOSIS — C719 Malignant neoplasm of brain, unspecified: Secondary | ICD-10-CM | POA: Diagnosis not present

## 2018-07-26 DIAGNOSIS — S0461XS Injury of acoustic nerve, right side, sequela: Secondary | ICD-10-CM | POA: Diagnosis not present

## 2018-08-09 DIAGNOSIS — Z01118 Encounter for examination of ears and hearing with other abnormal findings: Secondary | ICD-10-CM | POA: Diagnosis not present

## 2018-08-09 DIAGNOSIS — Z85841 Personal history of malignant neoplasm of brain: Secondary | ICD-10-CM | POA: Diagnosis not present

## 2018-08-09 DIAGNOSIS — H9011 Conductive hearing loss, unilateral, right ear, with unrestricted hearing on the contralateral side: Secondary | ICD-10-CM | POA: Diagnosis not present

## 2018-08-09 DIAGNOSIS — C719 Malignant neoplasm of brain, unspecified: Secondary | ICD-10-CM | POA: Diagnosis not present

## 2018-08-09 DIAGNOSIS — Z48811 Encounter for surgical aftercare following surgery on the nervous system: Secondary | ICD-10-CM | POA: Diagnosis not present

## 2018-08-09 DIAGNOSIS — H9311 Tinnitus, right ear: Secondary | ICD-10-CM | POA: Diagnosis not present

## 2018-08-23 DIAGNOSIS — C719 Malignant neoplasm of brain, unspecified: Secondary | ICD-10-CM | POA: Diagnosis not present

## 2018-08-23 DIAGNOSIS — Z79899 Other long term (current) drug therapy: Secondary | ICD-10-CM | POA: Diagnosis not present

## 2018-08-23 DIAGNOSIS — H4911 Fourth [trochlear] nerve palsy, right eye: Secondary | ICD-10-CM | POA: Diagnosis not present

## 2018-08-23 DIAGNOSIS — G40409 Other generalized epilepsy and epileptic syndromes, not intractable, without status epilepticus: Secondary | ICD-10-CM | POA: Diagnosis not present

## 2018-08-23 DIAGNOSIS — S0461XS Injury of acoustic nerve, right side, sequela: Secondary | ICD-10-CM | POA: Diagnosis not present

## 2018-08-23 DIAGNOSIS — Z483 Aftercare following surgery for neoplasm: Secondary | ICD-10-CM | POA: Diagnosis not present

## 2018-08-23 DIAGNOSIS — L708 Other acne: Secondary | ICD-10-CM | POA: Diagnosis not present

## 2018-08-23 DIAGNOSIS — X58XXXS Exposure to other specified factors, sequela: Secondary | ICD-10-CM | POA: Diagnosis not present

## 2018-08-23 DIAGNOSIS — R197 Diarrhea, unspecified: Secondary | ICD-10-CM | POA: Diagnosis not present

## 2018-08-23 DIAGNOSIS — T50995D Adverse effect of other drugs, medicaments and biological substances, subsequent encounter: Secondary | ICD-10-CM | POA: Diagnosis not present

## 2018-08-23 DIAGNOSIS — C712 Malignant neoplasm of temporal lobe: Secondary | ICD-10-CM | POA: Diagnosis not present

## 2018-08-23 DIAGNOSIS — R569 Unspecified convulsions: Secondary | ICD-10-CM | POA: Diagnosis not present

## 2018-08-30 DIAGNOSIS — H4911 Fourth [trochlear] nerve palsy, right eye: Secondary | ICD-10-CM | POA: Diagnosis not present

## 2018-08-30 DIAGNOSIS — C719 Malignant neoplasm of brain, unspecified: Secondary | ICD-10-CM | POA: Diagnosis not present

## 2018-08-31 DIAGNOSIS — G40909 Epilepsy, unspecified, not intractable, without status epilepticus: Secondary | ICD-10-CM | POA: Diagnosis not present

## 2018-08-31 DIAGNOSIS — C719 Malignant neoplasm of brain, unspecified: Secondary | ICD-10-CM | POA: Diagnosis not present

## 2018-08-31 DIAGNOSIS — H4911 Fourth [trochlear] nerve palsy, right eye: Secondary | ICD-10-CM | POA: Diagnosis not present

## 2018-08-31 DIAGNOSIS — D496 Neoplasm of unspecified behavior of brain: Secondary | ICD-10-CM | POA: Diagnosis not present

## 2018-09-20 DIAGNOSIS — X58XXXS Exposure to other specified factors, sequela: Secondary | ICD-10-CM | POA: Diagnosis not present

## 2018-09-20 DIAGNOSIS — R569 Unspecified convulsions: Secondary | ICD-10-CM | POA: Diagnosis not present

## 2018-09-20 DIAGNOSIS — H4911 Fourth [trochlear] nerve palsy, right eye: Secondary | ICD-10-CM | POA: Diagnosis not present

## 2018-09-20 DIAGNOSIS — C719 Malignant neoplasm of brain, unspecified: Secondary | ICD-10-CM | POA: Diagnosis not present

## 2018-09-20 DIAGNOSIS — S0461XS Injury of acoustic nerve, right side, sequela: Secondary | ICD-10-CM | POA: Diagnosis not present

## 2018-10-18 DIAGNOSIS — G40909 Epilepsy, unspecified, not intractable, without status epilepticus: Secondary | ICD-10-CM | POA: Diagnosis not present

## 2018-10-18 DIAGNOSIS — I071 Rheumatic tricuspid insufficiency: Secondary | ICD-10-CM | POA: Diagnosis not present

## 2018-10-18 DIAGNOSIS — D496 Neoplasm of unspecified behavior of brain: Secondary | ICD-10-CM | POA: Diagnosis not present

## 2018-10-18 DIAGNOSIS — C712 Malignant neoplasm of temporal lobe: Secondary | ICD-10-CM | POA: Diagnosis not present

## 2018-10-18 DIAGNOSIS — C719 Malignant neoplasm of brain, unspecified: Secondary | ICD-10-CM | POA: Diagnosis not present

## 2018-10-22 DIAGNOSIS — C718 Malignant neoplasm of overlapping sites of brain: Secondary | ICD-10-CM | POA: Diagnosis not present

## 2018-10-22 DIAGNOSIS — R413 Other amnesia: Secondary | ICD-10-CM | POA: Diagnosis not present

## 2018-10-22 DIAGNOSIS — C719 Malignant neoplasm of brain, unspecified: Secondary | ICD-10-CM | POA: Diagnosis not present

## 2018-10-22 DIAGNOSIS — H4911 Fourth [trochlear] nerve palsy, right eye: Secondary | ICD-10-CM | POA: Diagnosis not present

## 2018-10-22 DIAGNOSIS — C712 Malignant neoplasm of temporal lobe: Secondary | ICD-10-CM | POA: Diagnosis not present

## 2018-10-22 DIAGNOSIS — Z08 Encounter for follow-up examination after completed treatment for malignant neoplasm: Secondary | ICD-10-CM | POA: Diagnosis not present

## 2018-10-22 DIAGNOSIS — Z85841 Personal history of malignant neoplasm of brain: Secondary | ICD-10-CM | POA: Diagnosis not present

## 2018-10-22 DIAGNOSIS — Z9889 Other specified postprocedural states: Secondary | ICD-10-CM | POA: Diagnosis not present

## 2018-10-22 DIAGNOSIS — H532 Diplopia: Secondary | ICD-10-CM | POA: Diagnosis not present

## 2018-10-22 DIAGNOSIS — H53451 Other localized visual field defect, right eye: Secondary | ICD-10-CM | POA: Diagnosis not present

## 2018-10-29 ENCOUNTER — Other Ambulatory Visit: Payer: Self-pay | Admitting: Family Medicine

## 2018-10-29 DIAGNOSIS — F410 Panic disorder [episodic paroxysmal anxiety] without agoraphobia: Secondary | ICD-10-CM

## 2018-10-29 DIAGNOSIS — F430 Acute stress reaction: Secondary | ICD-10-CM

## 2018-10-30 NOTE — Telephone Encounter (Signed)
Last office visit 07/05/2018 for GAD.  AVS states to return in about 2 weeks (around 07/19/2018) for Phone or myChart to check in on meds.  No future appointments.  Ok to refill?

## 2018-10-30 NOTE — Telephone Encounter (Signed)
Please call patient:  Is he still taking Zoloft 100 mg daily for anxiety?  Based off of Dr. Verda Cumins last note the plan was to start titrating down given his reduction in overall anxiety. Has he titrated down the medication?  How is his anxiety overall?

## 2018-10-30 NOTE — Telephone Encounter (Signed)
Noted and appreciate the follow through.  Rx for Zoloft 50 mg tablets sent to pharmacy.

## 2018-10-30 NOTE — Telephone Encounter (Signed)
Called both numbers. Spoke with Maudie Mercury, patient's mom, ok per DPR on file. Patient has been taking Sertraline 100 mg 1/2 tablet daily and seems to be doing fine on that dose per mom. I advised that we would send RX in for 50 mg 1 daily to make it more convinient and Maudie Mercury stated that would help. I updated medication refill with the dose. Please review

## 2018-11-15 DIAGNOSIS — Z006 Encounter for examination for normal comparison and control in clinical research program: Secondary | ICD-10-CM | POA: Diagnosis not present

## 2018-11-15 DIAGNOSIS — C719 Malignant neoplasm of brain, unspecified: Secondary | ICD-10-CM | POA: Diagnosis not present

## 2018-11-15 DIAGNOSIS — H4911 Fourth [trochlear] nerve palsy, right eye: Secondary | ICD-10-CM | POA: Diagnosis not present

## 2018-12-13 DIAGNOSIS — L708 Other acne: Secondary | ICD-10-CM | POA: Diagnosis not present

## 2018-12-13 DIAGNOSIS — G4089 Other seizures: Secondary | ICD-10-CM | POA: Diagnosis not present

## 2018-12-13 DIAGNOSIS — T50995A Adverse effect of other drugs, medicaments and biological substances, initial encounter: Secondary | ICD-10-CM | POA: Diagnosis not present

## 2018-12-13 DIAGNOSIS — R21 Rash and other nonspecific skin eruption: Secondary | ICD-10-CM | POA: Diagnosis not present

## 2018-12-13 DIAGNOSIS — Z9889 Other specified postprocedural states: Secondary | ICD-10-CM | POA: Diagnosis not present

## 2018-12-13 DIAGNOSIS — L568 Other specified acute skin changes due to ultraviolet radiation: Secondary | ICD-10-CM | POA: Diagnosis not present

## 2018-12-13 DIAGNOSIS — Z79899 Other long term (current) drug therapy: Secondary | ICD-10-CM | POA: Diagnosis not present

## 2018-12-13 DIAGNOSIS — X58XXXA Exposure to other specified factors, initial encounter: Secondary | ICD-10-CM | POA: Diagnosis not present

## 2018-12-13 DIAGNOSIS — Z006 Encounter for examination for normal comparison and control in clinical research program: Secondary | ICD-10-CM | POA: Diagnosis not present

## 2018-12-13 DIAGNOSIS — C719 Malignant neoplasm of brain, unspecified: Secondary | ICD-10-CM | POA: Diagnosis not present

## 2018-12-17 DIAGNOSIS — C719 Malignant neoplasm of brain, unspecified: Secondary | ICD-10-CM | POA: Diagnosis not present

## 2018-12-17 DIAGNOSIS — G40909 Epilepsy, unspecified, not intractable, without status epilepticus: Secondary | ICD-10-CM | POA: Diagnosis not present

## 2018-12-17 DIAGNOSIS — D496 Neoplasm of unspecified behavior of brain: Secondary | ICD-10-CM | POA: Diagnosis not present

## 2019-01-10 DIAGNOSIS — C719 Malignant neoplasm of brain, unspecified: Secondary | ICD-10-CM | POA: Diagnosis not present

## 2019-01-10 DIAGNOSIS — C712 Malignant neoplasm of temporal lobe: Secondary | ICD-10-CM | POA: Diagnosis not present

## 2019-01-10 DIAGNOSIS — H4911 Fourth [trochlear] nerve palsy, right eye: Secondary | ICD-10-CM | POA: Diagnosis not present

## 2019-01-10 DIAGNOSIS — R569 Unspecified convulsions: Secondary | ICD-10-CM | POA: Diagnosis not present

## 2019-01-10 DIAGNOSIS — G40409 Other generalized epilepsy and epileptic syndromes, not intractable, without status epilepticus: Secondary | ICD-10-CM | POA: Diagnosis not present

## 2019-01-10 DIAGNOSIS — Z79899 Other long term (current) drug therapy: Secondary | ICD-10-CM | POA: Diagnosis not present

## 2019-02-11 DIAGNOSIS — Z006 Encounter for examination for normal comparison and control in clinical research program: Secondary | ICD-10-CM | POA: Diagnosis not present

## 2019-02-11 DIAGNOSIS — Z23 Encounter for immunization: Secondary | ICD-10-CM | POA: Diagnosis not present

## 2019-02-11 DIAGNOSIS — C719 Malignant neoplasm of brain, unspecified: Secondary | ICD-10-CM | POA: Diagnosis not present

## 2019-03-11 DIAGNOSIS — G4089 Other seizures: Secondary | ICD-10-CM | POA: Diagnosis not present

## 2019-03-11 DIAGNOSIS — R197 Diarrhea, unspecified: Secondary | ICD-10-CM | POA: Diagnosis not present

## 2019-03-11 DIAGNOSIS — L708 Other acne: Secondary | ICD-10-CM | POA: Diagnosis not present

## 2019-03-11 DIAGNOSIS — T50995D Adverse effect of other drugs, medicaments and biological substances, subsequent encounter: Secondary | ICD-10-CM | POA: Diagnosis not present

## 2019-03-11 DIAGNOSIS — L568 Other specified acute skin changes due to ultraviolet radiation: Secondary | ICD-10-CM | POA: Diagnosis not present

## 2019-03-11 DIAGNOSIS — C712 Malignant neoplasm of temporal lobe: Secondary | ICD-10-CM | POA: Diagnosis not present

## 2019-03-11 DIAGNOSIS — Z006 Encounter for examination for normal comparison and control in clinical research program: Secondary | ICD-10-CM | POA: Diagnosis not present

## 2019-03-11 DIAGNOSIS — Z79899 Other long term (current) drug therapy: Secondary | ICD-10-CM | POA: Diagnosis not present

## 2019-03-11 DIAGNOSIS — C719 Malignant neoplasm of brain, unspecified: Secondary | ICD-10-CM | POA: Diagnosis not present

## 2019-03-11 DIAGNOSIS — Z9889 Other specified postprocedural states: Secondary | ICD-10-CM | POA: Diagnosis not present

## 2019-04-11 DIAGNOSIS — C712 Malignant neoplasm of temporal lobe: Secondary | ICD-10-CM | POA: Diagnosis not present

## 2019-04-11 DIAGNOSIS — Z79899 Other long term (current) drug therapy: Secondary | ICD-10-CM | POA: Diagnosis not present

## 2019-04-11 DIAGNOSIS — G40509 Epileptic seizures related to external causes, not intractable, without status epilepticus: Secondary | ICD-10-CM | POA: Diagnosis not present

## 2019-04-11 DIAGNOSIS — C719 Malignant neoplasm of brain, unspecified: Secondary | ICD-10-CM | POA: Diagnosis not present

## 2019-04-17 DIAGNOSIS — G40109 Localization-related (focal) (partial) symptomatic epilepsy and epileptic syndromes with simple partial seizures, not intractable, without status epilepticus: Secondary | ICD-10-CM | POA: Diagnosis not present

## 2019-04-17 DIAGNOSIS — G40909 Epilepsy, unspecified, not intractable, without status epilepticus: Secondary | ICD-10-CM | POA: Diagnosis not present

## 2019-04-17 DIAGNOSIS — D496 Neoplasm of unspecified behavior of brain: Secondary | ICD-10-CM | POA: Diagnosis not present

## 2019-04-22 DIAGNOSIS — C719 Malignant neoplasm of brain, unspecified: Secondary | ICD-10-CM | POA: Diagnosis not present

## 2019-04-26 DIAGNOSIS — R9401 Abnormal electroencephalogram [EEG]: Secondary | ICD-10-CM | POA: Diagnosis not present

## 2019-04-26 DIAGNOSIS — C712 Malignant neoplasm of temporal lobe: Secondary | ICD-10-CM | POA: Diagnosis not present

## 2019-04-26 DIAGNOSIS — G40909 Epilepsy, unspecified, not intractable, without status epilepticus: Secondary | ICD-10-CM | POA: Diagnosis not present

## 2019-04-26 DIAGNOSIS — R569 Unspecified convulsions: Secondary | ICD-10-CM | POA: Diagnosis not present

## 2019-04-26 DIAGNOSIS — D496 Neoplasm of unspecified behavior of brain: Secondary | ICD-10-CM | POA: Diagnosis not present

## 2019-05-07 DIAGNOSIS — Z792 Long term (current) use of antibiotics: Secondary | ICD-10-CM | POA: Diagnosis not present

## 2019-05-07 DIAGNOSIS — C719 Malignant neoplasm of brain, unspecified: Secondary | ICD-10-CM | POA: Diagnosis not present

## 2019-05-07 DIAGNOSIS — Z5111 Encounter for antineoplastic chemotherapy: Secondary | ICD-10-CM | POA: Diagnosis not present

## 2019-05-07 DIAGNOSIS — C712 Malignant neoplasm of temporal lobe: Secondary | ICD-10-CM | POA: Diagnosis not present

## 2019-05-07 DIAGNOSIS — G4089 Other seizures: Secondary | ICD-10-CM | POA: Diagnosis not present

## 2019-05-07 DIAGNOSIS — Z79899 Other long term (current) drug therapy: Secondary | ICD-10-CM | POA: Diagnosis not present

## 2019-05-14 DIAGNOSIS — H9011 Conductive hearing loss, unilateral, right ear, with unrestricted hearing on the contralateral side: Secondary | ICD-10-CM | POA: Diagnosis not present

## 2019-05-14 DIAGNOSIS — H9311 Tinnitus, right ear: Secondary | ICD-10-CM | POA: Diagnosis not present

## 2019-05-14 DIAGNOSIS — Z85841 Personal history of malignant neoplasm of brain: Secondary | ICD-10-CM | POA: Diagnosis not present

## 2019-05-14 DIAGNOSIS — H938X1 Other specified disorders of right ear: Secondary | ICD-10-CM | POA: Diagnosis not present

## 2019-05-18 ENCOUNTER — Ambulatory Visit: Payer: BC Managed Care – PPO | Attending: Internal Medicine

## 2019-05-18 DIAGNOSIS — Z23 Encounter for immunization: Secondary | ICD-10-CM

## 2019-05-18 NOTE — Progress Notes (Signed)
   Covid-19 Vaccination Clinic  Name:  Dreu Tischner    MRN: [id number] DOB: [birth year]  05/18/2019  Mr. Frappier was observed post Covid-19 immunization for 15 minutes without incident. He was provided with Vaccine Information Sheet and instruction to access the V-Safe system.   Mr. Shrewsbury was instructed to call 911 with any severe reactions post vaccine: Marland Kitchen Difficulty breathing  . Swelling of face and throat  . A fast heartbeat  . A bad rash all over body  . Dizziness and weakness   Immunizations Administered    Name Date Dose VIS Date Route   Pfizer COVID-19 Vaccine 05/18/2019  6:02 PM 0.3 mL 02/22/2019 Intramuscular   Manufacturer: [place]   Lot: [id number]   [place]: [id number]

## 2019-05-21 DIAGNOSIS — H9011 Conductive hearing loss, unilateral, right ear, with unrestricted hearing on the contralateral side: Secondary | ICD-10-CM | POA: Diagnosis not present

## 2019-05-21 DIAGNOSIS — Z85841 Personal history of malignant neoplasm of brain: Secondary | ICD-10-CM | POA: Diagnosis not present

## 2019-05-21 DIAGNOSIS — H74311 Ankylosis of ear ossicles, right ear: Secondary | ICD-10-CM | POA: Diagnosis not present

## 2019-05-21 DIAGNOSIS — C719 Malignant neoplasm of brain, unspecified: Secondary | ICD-10-CM | POA: Diagnosis not present

## 2019-05-21 DIAGNOSIS — H938X1 Other specified disorders of right ear: Secondary | ICD-10-CM | POA: Diagnosis not present

## 2019-06-06 DIAGNOSIS — H9011 Conductive hearing loss, unilateral, right ear, with unrestricted hearing on the contralateral side: Secondary | ICD-10-CM | POA: Diagnosis not present

## 2019-06-07 DIAGNOSIS — C719 Malignant neoplasm of brain, unspecified: Secondary | ICD-10-CM | POA: Diagnosis not present

## 2019-06-07 DIAGNOSIS — C712 Malignant neoplasm of temporal lobe: Secondary | ICD-10-CM | POA: Diagnosis not present

## 2019-06-07 DIAGNOSIS — Z792 Long term (current) use of antibiotics: Secondary | ICD-10-CM | POA: Diagnosis not present

## 2019-06-07 DIAGNOSIS — Z79899 Other long term (current) drug therapy: Secondary | ICD-10-CM | POA: Diagnosis not present

## 2019-06-07 DIAGNOSIS — Z5111 Encounter for antineoplastic chemotherapy: Secondary | ICD-10-CM | POA: Diagnosis not present

## 2019-06-07 DIAGNOSIS — G4089 Other seizures: Secondary | ICD-10-CM | POA: Diagnosis not present

## 2019-06-08 ENCOUNTER — Ambulatory Visit: Payer: BC Managed Care – PPO | Attending: Internal Medicine

## 2019-06-08 DIAGNOSIS — Z23 Encounter for immunization: Secondary | ICD-10-CM

## 2019-06-08 NOTE — Progress Notes (Signed)
   Covid-19 Vaccination Clinic  Name:  Craig Ruiz    MRN: [id number] DOB: 1995-11-16  06/08/2019  [name] was observed post Covid-19 immunization for 15 minutes without incident. He was provided with Vaccine Information Sheet and instruction to access the V-Safe system.   [name] was instructed to call 911 with any severe reactions post vaccine: Marland Kitchen Difficulty breathing  . Swelling of face and throat  . A fast heartbeat  . A bad rash all over body  . Dizziness and weakness   Immunizations Administered    Name Date Dose VIS Date Route   Pfizer COVID-19 Vaccine 06/08/2019  8:16 AM 0.3 mL 02/22/2019 Intramuscular   Manufacturer: [place]   Lot: [id number]   [place]: [id number]

## 2019-06-18 ENCOUNTER — Ambulatory Visit: Payer: BC Managed Care – PPO

## 2019-07-05 DIAGNOSIS — F329 Major depressive disorder, single episode, unspecified: Secondary | ICD-10-CM | POA: Diagnosis not present

## 2019-07-05 DIAGNOSIS — D496 Neoplasm of unspecified behavior of brain: Secondary | ICD-10-CM | POA: Diagnosis not present

## 2019-07-05 DIAGNOSIS — F419 Anxiety disorder, unspecified: Secondary | ICD-10-CM | POA: Diagnosis not present

## 2019-07-05 DIAGNOSIS — J302 Other seasonal allergic rhinitis: Secondary | ICD-10-CM | POA: Diagnosis not present

## 2019-07-05 DIAGNOSIS — C712 Malignant neoplasm of temporal lobe: Secondary | ICD-10-CM | POA: Diagnosis not present

## 2019-07-05 DIAGNOSIS — R569 Unspecified convulsions: Secondary | ICD-10-CM | POA: Diagnosis not present

## 2019-07-05 DIAGNOSIS — R110 Nausea: Secondary | ICD-10-CM | POA: Diagnosis not present

## 2019-07-05 DIAGNOSIS — C719 Malignant neoplasm of brain, unspecified: Secondary | ICD-10-CM | POA: Diagnosis not present

## 2019-07-05 DIAGNOSIS — H9191 Unspecified hearing loss, right ear: Secondary | ICD-10-CM | POA: Diagnosis not present

## 2019-07-05 DIAGNOSIS — G40909 Epilepsy, unspecified, not intractable, without status epilepticus: Secondary | ICD-10-CM | POA: Diagnosis not present

## 2019-07-05 DIAGNOSIS — Z5111 Encounter for antineoplastic chemotherapy: Secondary | ICD-10-CM | POA: Diagnosis not present

## 2019-07-22 IMAGING — CR DG CHEST 2V
2 series · 2 of 2 positions shown · non-contrast
Comparison: None.

CLINICAL DATA: Palpitations for months, worse today.

EXAM:
CHEST  2 VIEW

[chest pa]
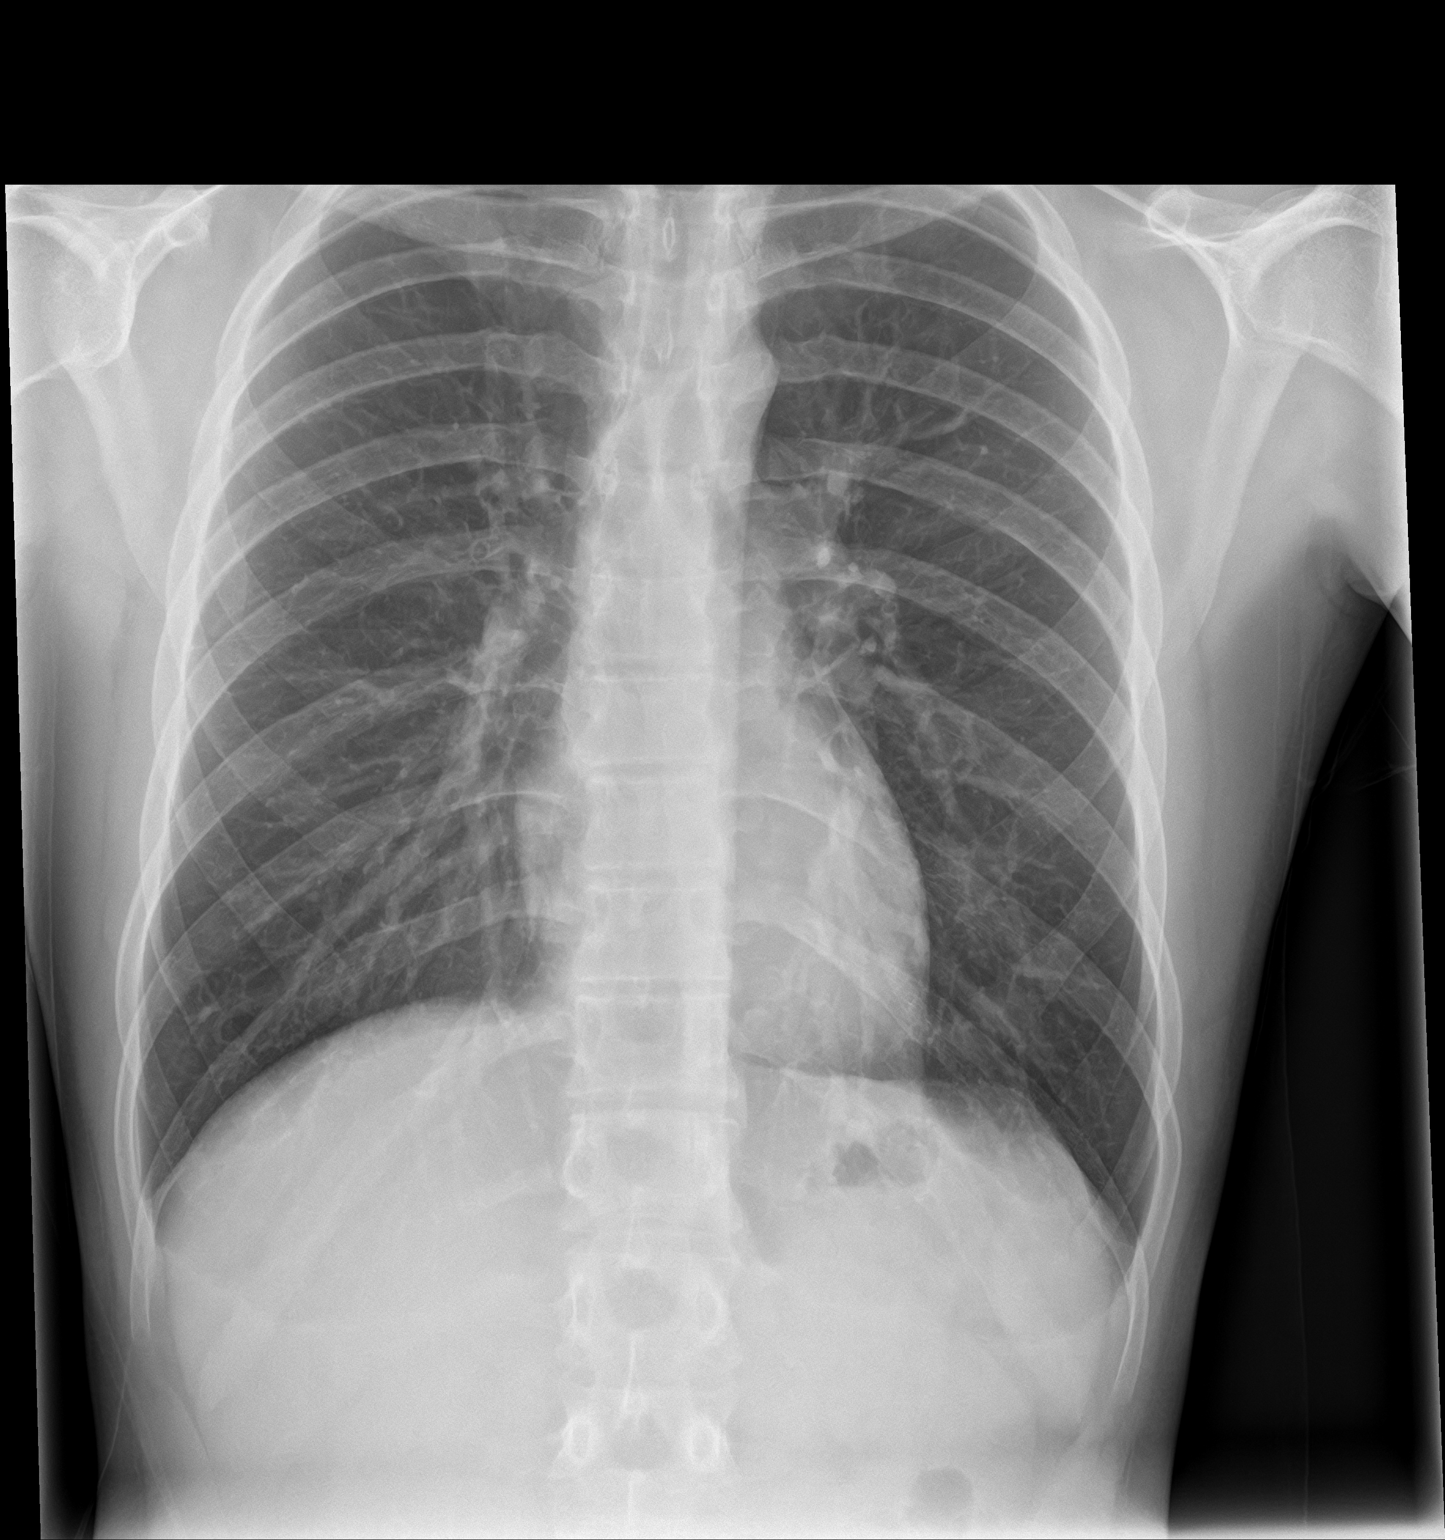

[chest lat]
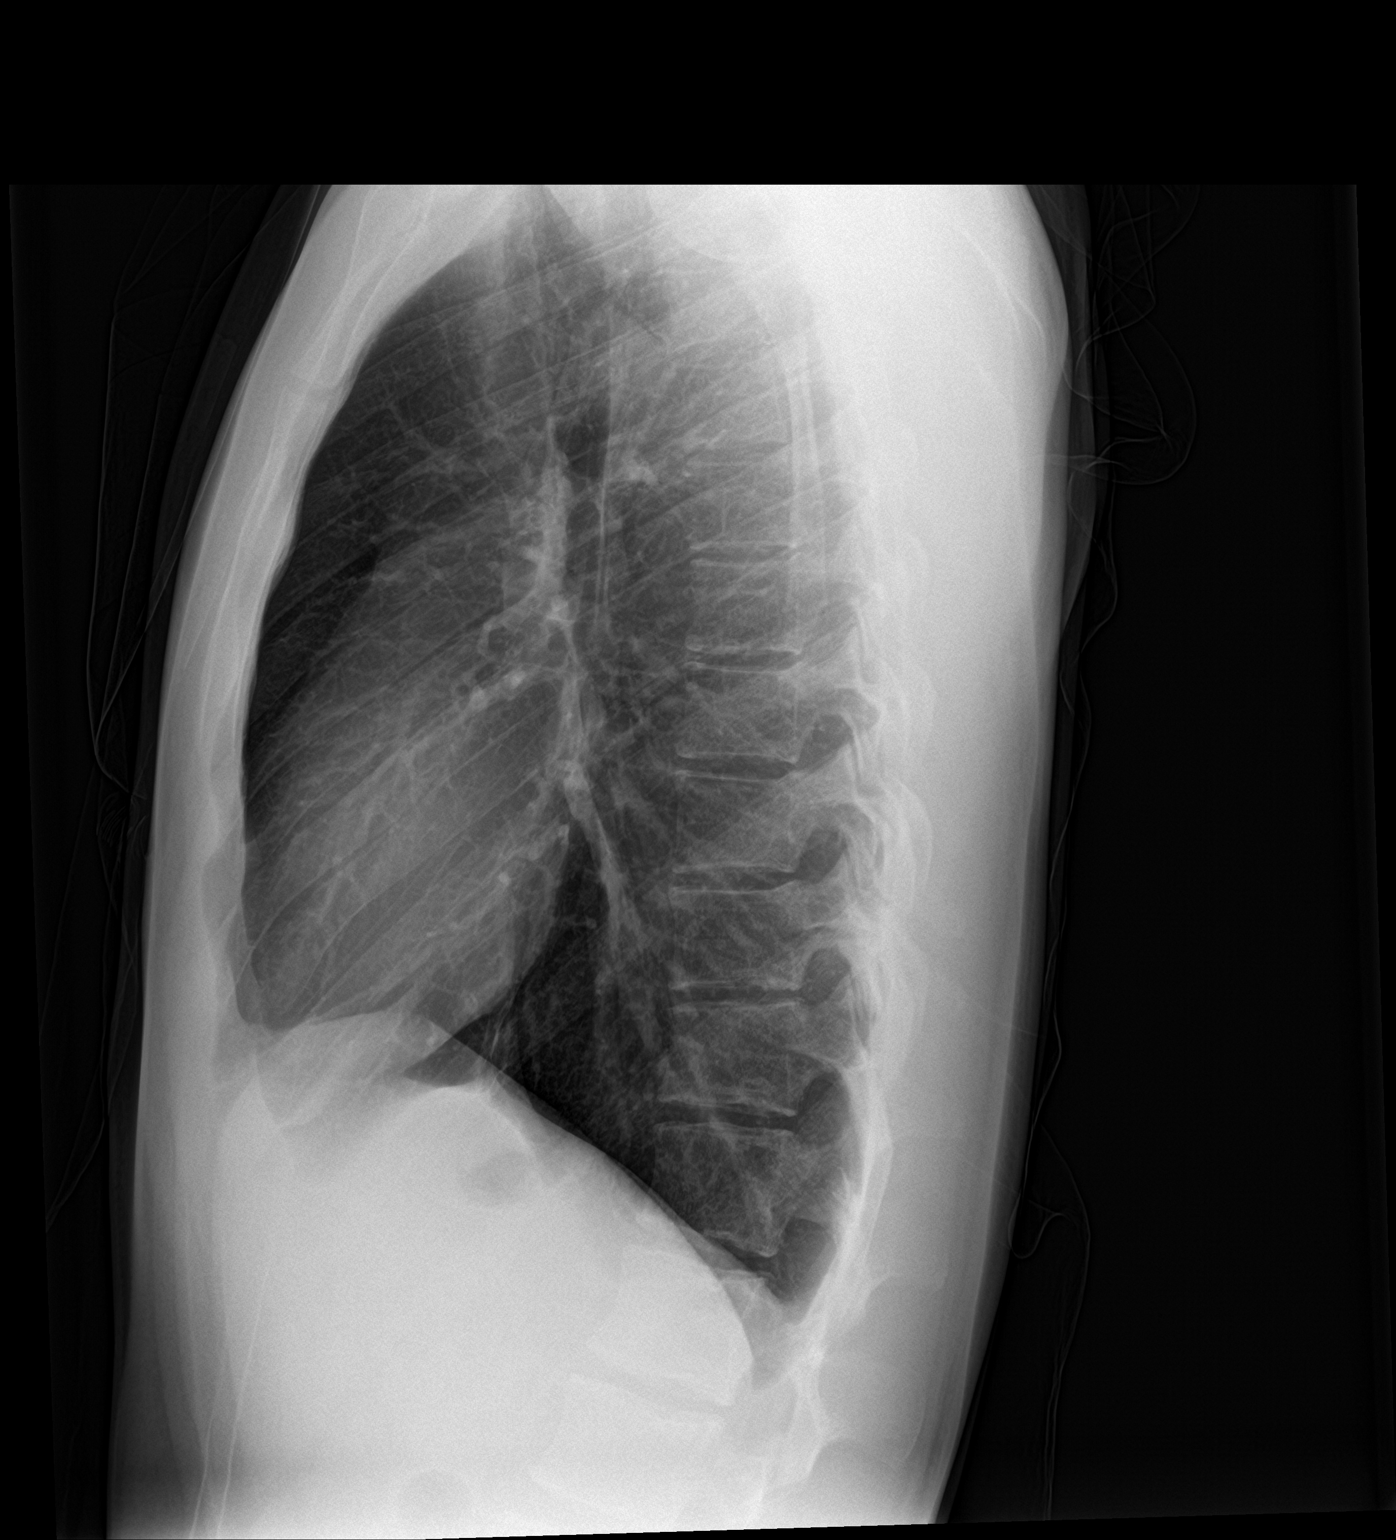

[2 of 2 positions shown; findings below may reference images not displayed]

FINDINGS: Cardiomediastinal silhouette is normal in size and configuration.
Lungs are clear. Lung volumes are normal. No evidence of pneumonia.
No pleural effusion. No pneumothorax seen.

Osseous and soft tissue structures about the chest are unremarkable.
IMPRESSION: Normal chest x-ray.

## 2019-08-02 DIAGNOSIS — C712 Malignant neoplasm of temporal lobe: Secondary | ICD-10-CM | POA: Diagnosis not present

## 2019-08-02 DIAGNOSIS — Z79899 Other long term (current) drug therapy: Secondary | ICD-10-CM | POA: Diagnosis not present

## 2019-08-02 DIAGNOSIS — G40909 Epilepsy, unspecified, not intractable, without status epilepticus: Secondary | ICD-10-CM | POA: Diagnosis not present

## 2019-08-02 DIAGNOSIS — C719 Malignant neoplasm of brain, unspecified: Secondary | ICD-10-CM | POA: Diagnosis not present

## 2019-08-02 DIAGNOSIS — Z5111 Encounter for antineoplastic chemotherapy: Secondary | ICD-10-CM | POA: Diagnosis not present

## 2019-08-02 DIAGNOSIS — D496 Neoplasm of unspecified behavior of brain: Secondary | ICD-10-CM | POA: Diagnosis not present

## 2019-08-08 DIAGNOSIS — C719 Malignant neoplasm of brain, unspecified: Secondary | ICD-10-CM | POA: Diagnosis not present

## 2019-08-08 DIAGNOSIS — H9011 Conductive hearing loss, unilateral, right ear, with unrestricted hearing on the contralateral side: Secondary | ICD-10-CM | POA: Diagnosis not present

## 2019-08-23 DIAGNOSIS — G40909 Epilepsy, unspecified, not intractable, without status epilepticus: Secondary | ICD-10-CM | POA: Diagnosis not present

## 2019-08-23 DIAGNOSIS — D496 Neoplasm of unspecified behavior of brain: Secondary | ICD-10-CM | POA: Diagnosis not present

## 2019-08-23 DIAGNOSIS — C719 Malignant neoplasm of brain, unspecified: Secondary | ICD-10-CM | POA: Diagnosis not present

## 2019-08-29 DIAGNOSIS — C719 Malignant neoplasm of brain, unspecified: Secondary | ICD-10-CM | POA: Diagnosis not present

## 2019-08-29 DIAGNOSIS — G40909 Epilepsy, unspecified, not intractable, without status epilepticus: Secondary | ICD-10-CM | POA: Diagnosis not present

## 2019-08-29 DIAGNOSIS — D496 Neoplasm of unspecified behavior of brain: Secondary | ICD-10-CM | POA: Diagnosis not present

## 2019-08-29 DIAGNOSIS — R569 Unspecified convulsions: Secondary | ICD-10-CM | POA: Diagnosis not present

## 2019-09-05 ENCOUNTER — Other Ambulatory Visit: Payer: Self-pay | Admitting: Primary Care

## 2019-09-05 DIAGNOSIS — G40119 Localization-related (focal) (partial) symptomatic epilepsy and epileptic syndromes with simple partial seizures, intractable, without status epilepticus: Secondary | ICD-10-CM | POA: Diagnosis not present

## 2019-09-05 DIAGNOSIS — G40804 Other epilepsy, intractable, without status epilepticus: Secondary | ICD-10-CM | POA: Diagnosis not present

## 2019-09-05 DIAGNOSIS — F410 Panic disorder [episodic paroxysmal anxiety] without agoraphobia: Secondary | ICD-10-CM

## 2019-09-05 DIAGNOSIS — G40919 Epilepsy, unspecified, intractable, without status epilepticus: Secondary | ICD-10-CM | POA: Diagnosis not present

## 2019-09-05 DIAGNOSIS — Z79899 Other long term (current) drug therapy: Secondary | ICD-10-CM | POA: Diagnosis not present

## 2019-09-05 DIAGNOSIS — F430 Acute stress reaction: Secondary | ICD-10-CM

## 2019-09-05 DIAGNOSIS — C719 Malignant neoplasm of brain, unspecified: Secondary | ICD-10-CM | POA: Diagnosis not present

## 2019-09-05 DIAGNOSIS — G40901 Epilepsy, unspecified, not intractable, with status epilepticus: Secondary | ICD-10-CM | POA: Diagnosis not present

## 2019-09-05 DIAGNOSIS — H4911 Fourth [trochlear] nerve palsy, right eye: Secondary | ICD-10-CM | POA: Diagnosis not present

## 2019-09-05 NOTE — Telephone Encounter (Signed)
Please have patient schedule follow up appointment.

## 2019-09-05 NOTE — Telephone Encounter (Signed)
Last prescribed on 10/20/2018 by Allie Bossier Last OV (follow up) with Dr Einar Pheasant on 07/05/2018 No future OV scheduled

## 2019-09-12 DIAGNOSIS — F09 Unspecified mental disorder due to known physiological condition: Secondary | ICD-10-CM | POA: Diagnosis not present

## 2019-09-12 DIAGNOSIS — D496 Neoplasm of unspecified behavior of brain: Secondary | ICD-10-CM | POA: Diagnosis not present

## 2019-09-12 DIAGNOSIS — Z01818 Encounter for other preprocedural examination: Secondary | ICD-10-CM | POA: Diagnosis not present

## 2019-09-12 DIAGNOSIS — C712 Malignant neoplasm of temporal lobe: Secondary | ICD-10-CM | POA: Diagnosis not present

## 2019-09-12 DIAGNOSIS — G40909 Epilepsy, unspecified, not intractable, without status epilepticus: Secondary | ICD-10-CM | POA: Diagnosis not present

## 2019-09-12 DIAGNOSIS — G40119 Localization-related (focal) (partial) symptomatic epilepsy and epileptic syndromes with simple partial seizures, intractable, without status epilepticus: Secondary | ICD-10-CM | POA: Diagnosis not present

## 2019-09-26 DIAGNOSIS — C719 Malignant neoplasm of brain, unspecified: Secondary | ICD-10-CM | POA: Diagnosis not present

## 2019-09-26 DIAGNOSIS — H4911 Fourth [trochlear] nerve palsy, right eye: Secondary | ICD-10-CM | POA: Diagnosis not present

## 2019-09-30 DIAGNOSIS — C719 Malignant neoplasm of brain, unspecified: Secondary | ICD-10-CM | POA: Diagnosis not present

## 2019-09-30 DIAGNOSIS — G40909 Epilepsy, unspecified, not intractable, without status epilepticus: Secondary | ICD-10-CM | POA: Diagnosis not present

## 2019-09-30 DIAGNOSIS — F09 Unspecified mental disorder due to known physiological condition: Secondary | ICD-10-CM | POA: Diagnosis not present

## 2019-09-30 DIAGNOSIS — R4701 Aphasia: Secondary | ICD-10-CM | POA: Diagnosis not present

## 2019-10-10 DIAGNOSIS — C719 Malignant neoplasm of brain, unspecified: Secondary | ICD-10-CM | POA: Diagnosis not present

## 2019-10-10 DIAGNOSIS — D496 Neoplasm of unspecified behavior of brain: Secondary | ICD-10-CM | POA: Diagnosis not present

## 2019-10-10 DIAGNOSIS — G40909 Epilepsy, unspecified, not intractable, without status epilepticus: Secondary | ICD-10-CM | POA: Diagnosis not present

## 2019-10-28 DIAGNOSIS — G40909 Epilepsy, unspecified, not intractable, without status epilepticus: Secondary | ICD-10-CM | POA: Diagnosis not present

## 2019-10-28 DIAGNOSIS — D496 Neoplasm of unspecified behavior of brain: Secondary | ICD-10-CM | POA: Diagnosis not present

## 2019-10-31 DIAGNOSIS — G40119 Localization-related (focal) (partial) symptomatic epilepsy and epileptic syndromes with simple partial seizures, intractable, without status epilepticus: Secondary | ICD-10-CM | POA: Diagnosis not present

## 2019-10-31 DIAGNOSIS — G40919 Epilepsy, unspecified, intractable, without status epilepticus: Secondary | ICD-10-CM | POA: Diagnosis not present

## 2019-10-31 DIAGNOSIS — D496 Neoplasm of unspecified behavior of brain: Secondary | ICD-10-CM | POA: Diagnosis not present

## 2019-10-31 DIAGNOSIS — Z01818 Encounter for other preprocedural examination: Secondary | ICD-10-CM | POA: Diagnosis not present

## 2019-11-06 DIAGNOSIS — Z79899 Other long term (current) drug therapy: Secondary | ICD-10-CM | POA: Diagnosis not present

## 2019-11-06 DIAGNOSIS — D696 Thrombocytopenia, unspecified: Secondary | ICD-10-CM | POA: Diagnosis not present

## 2019-11-12 DIAGNOSIS — C718 Malignant neoplasm of overlapping sites of brain: Secondary | ICD-10-CM | POA: Diagnosis not present

## 2019-11-12 DIAGNOSIS — G40119 Localization-related (focal) (partial) symptomatic epilepsy and epileptic syndromes with simple partial seizures, intractable, without status epilepticus: Secondary | ICD-10-CM | POA: Diagnosis not present

## 2019-11-14 DIAGNOSIS — D496 Neoplasm of unspecified behavior of brain: Secondary | ICD-10-CM | POA: Diagnosis not present

## 2019-11-14 DIAGNOSIS — G40909 Epilepsy, unspecified, not intractable, without status epilepticus: Secondary | ICD-10-CM | POA: Diagnosis not present

## 2019-11-14 DIAGNOSIS — C719 Malignant neoplasm of brain, unspecified: Secondary | ICD-10-CM | POA: Diagnosis not present

## 2019-11-26 ENCOUNTER — Other Ambulatory Visit: Payer: Self-pay | Admitting: Family Medicine

## 2019-11-26 DIAGNOSIS — F430 Acute stress reaction: Secondary | ICD-10-CM

## 2019-11-26 DIAGNOSIS — F411 Generalized anxiety disorder: Secondary | ICD-10-CM

## 2019-12-02 DIAGNOSIS — H9311 Tinnitus, right ear: Secondary | ICD-10-CM | POA: Diagnosis not present

## 2019-12-02 DIAGNOSIS — G936 Cerebral edema: Secondary | ICD-10-CM | POA: Diagnosis not present

## 2019-12-02 DIAGNOSIS — F411 Generalized anxiety disorder: Secondary | ICD-10-CM | POA: Diagnosis not present

## 2019-12-02 DIAGNOSIS — R339 Retention of urine, unspecified: Secondary | ICD-10-CM | POA: Diagnosis not present

## 2019-12-02 DIAGNOSIS — R110 Nausea: Secondary | ICD-10-CM | POA: Diagnosis not present

## 2019-12-02 DIAGNOSIS — G40802 Other epilepsy, not intractable, without status epilepticus: Secondary | ICD-10-CM | POA: Diagnosis not present

## 2019-12-02 DIAGNOSIS — C719 Malignant neoplasm of brain, unspecified: Secondary | ICD-10-CM | POA: Diagnosis not present

## 2019-12-02 DIAGNOSIS — Z9221 Personal history of antineoplastic chemotherapy: Secondary | ICD-10-CM | POA: Diagnosis not present

## 2019-12-02 DIAGNOSIS — R413 Other amnesia: Secondary | ICD-10-CM | POA: Diagnosis not present

## 2019-12-02 DIAGNOSIS — F329 Major depressive disorder, single episode, unspecified: Secondary | ICD-10-CM | POA: Diagnosis not present

## 2019-12-02 DIAGNOSIS — C712 Malignant neoplasm of temporal lobe: Secondary | ICD-10-CM | POA: Diagnosis not present

## 2019-12-02 DIAGNOSIS — H532 Diplopia: Secondary | ICD-10-CM | POA: Diagnosis not present

## 2019-12-02 DIAGNOSIS — G40919 Epilepsy, unspecified, intractable, without status epilepticus: Secondary | ICD-10-CM | POA: Diagnosis not present

## 2019-12-02 DIAGNOSIS — D430 Neoplasm of uncertain behavior of brain, supratentorial: Secondary | ICD-10-CM | POA: Diagnosis not present

## 2019-12-02 DIAGNOSIS — Z79899 Other long term (current) drug therapy: Secondary | ICD-10-CM | POA: Diagnosis not present

## 2019-12-26 ENCOUNTER — Other Ambulatory Visit: Payer: Self-pay | Admitting: Family Medicine

## 2019-12-26 DIAGNOSIS — F430 Acute stress reaction: Secondary | ICD-10-CM

## 2019-12-26 DIAGNOSIS — F410 Panic disorder [episodic paroxysmal anxiety] without agoraphobia: Secondary | ICD-10-CM

## 2019-12-26 DIAGNOSIS — F411 Generalized anxiety disorder: Secondary | ICD-10-CM

## 2020-01-16 DIAGNOSIS — H4911 Fourth [trochlear] nerve palsy, right eye: Secondary | ICD-10-CM | POA: Diagnosis not present

## 2020-01-16 DIAGNOSIS — C719 Malignant neoplasm of brain, unspecified: Secondary | ICD-10-CM | POA: Diagnosis not present

## 2020-01-16 DIAGNOSIS — Z9889 Other specified postprocedural states: Secondary | ICD-10-CM | POA: Diagnosis not present

## 2020-01-17 DIAGNOSIS — C719 Malignant neoplasm of brain, unspecified: Secondary | ICD-10-CM | POA: Diagnosis not present

## 2020-01-17 DIAGNOSIS — Z9889 Other specified postprocedural states: Secondary | ICD-10-CM | POA: Diagnosis not present

## 2020-01-17 DIAGNOSIS — G40909 Epilepsy, unspecified, not intractable, without status epilepticus: Secondary | ICD-10-CM | POA: Diagnosis not present

## 2020-01-17 DIAGNOSIS — D496 Neoplasm of unspecified behavior of brain: Secondary | ICD-10-CM | POA: Diagnosis not present
